# Patient Record
Sex: Female | Born: 1935 | Race: White | Hispanic: No | Marital: Married
Health system: Southern US, Community
[De-identification: ages and names within clinical notes are randomized; demographics above are authoritative.]

---

## 2007-03-16 ENCOUNTER — Ambulatory Visit: Payer: Self-pay | Admitting: Family Medicine

## 2008-03-31 ENCOUNTER — Other Ambulatory Visit: Payer: Self-pay

## 2008-03-31 ENCOUNTER — Inpatient Hospital Stay: Payer: Self-pay | Admitting: Internal Medicine

## 2008-04-11 ENCOUNTER — Ambulatory Visit: Payer: Self-pay | Admitting: Family Medicine

## 2008-05-05 ENCOUNTER — Ambulatory Visit: Payer: Self-pay | Admitting: Family Medicine

## 2008-10-15 ENCOUNTER — Ambulatory Visit: Payer: Self-pay | Admitting: Family Medicine

## 2009-08-28 ENCOUNTER — Ambulatory Visit: Payer: Self-pay | Admitting: Family Medicine

## 2010-12-28 ENCOUNTER — Ambulatory Visit: Payer: Self-pay | Admitting: Family Medicine

## 2011-06-01 ENCOUNTER — Ambulatory Visit: Payer: Self-pay | Admitting: Neurology

## 2011-08-14 ENCOUNTER — Inpatient Hospital Stay: Payer: Self-pay | Admitting: Internal Medicine

## 2012-08-27 ENCOUNTER — Inpatient Hospital Stay: Payer: Self-pay | Admitting: Student

## 2012-08-27 LAB — URINALYSIS, COMPLETE
Bilirubin,UR: NEGATIVE
Hyaline Cast: 5
Ketone: NEGATIVE
Ph: 5 (ref 4.5–8.0)
RBC,UR: 15 /HPF (ref 0–5)
Specific Gravity: 1.022 (ref 1.003–1.030)
Squamous Epithelial: 1

## 2012-08-27 LAB — CBC WITH DIFFERENTIAL/PLATELET
Basophil %: 0.5 %
Eosinophil %: 3.1 %
HGB: 12.7 g/dL (ref 12.0–16.0)
Lymphocyte #: 1.1 10*3/uL (ref 1.0–3.6)
Lymphocyte %: 15.7 %
MCH: 31.8 pg (ref 26.0–34.0)
Monocyte #: 0.6 x10 3/mm (ref 0.2–0.9)
Monocyte %: 8.8 %
Neutrophil %: 71.9 %
Platelet: 233 10*3/uL (ref 150–440)
RBC: 3.98 10*6/uL (ref 3.80–5.20)
WBC: 7.2 10*3/uL (ref 3.6–11.0)

## 2012-08-27 LAB — BASIC METABOLIC PANEL
Anion Gap: 8 (ref 7–16)
Chloride: 108 mmol/L — ABNORMAL HIGH (ref 98–107)
EGFR (African American): 40 — ABNORMAL LOW
EGFR (Non-African Amer.): 35 — ABNORMAL LOW
Glucose: 100 mg/dL — ABNORMAL HIGH (ref 65–99)
Potassium: 4.2 mmol/L (ref 3.5–5.1)
Sodium: 141 mmol/L (ref 136–145)

## 2012-08-27 LAB — CK TOTAL AND CKMB (NOT AT ARMC)
CK, Total: 208 U/L (ref 21–215)
CK-MB: 13.2 ng/mL — ABNORMAL HIGH (ref 0.5–3.6)

## 2012-08-28 LAB — BASIC METABOLIC PANEL
Anion Gap: 10 (ref 7–16)
Calcium, Total: 8.4 mg/dL — ABNORMAL LOW (ref 8.5–10.1)
Co2: 22 mmol/L (ref 21–32)
EGFR (African American): 36 — ABNORMAL LOW
Potassium: 4 mmol/L (ref 3.5–5.1)
Sodium: 139 mmol/L (ref 136–145)

## 2012-08-29 LAB — BASIC METABOLIC PANEL
Anion Gap: 10 (ref 7–16)
Calcium, Total: 8.6 mg/dL (ref 8.5–10.1)
Chloride: 110 mmol/L — ABNORMAL HIGH (ref 98–107)
Co2: 21 mmol/L (ref 21–32)
Glucose: 163 mg/dL — ABNORMAL HIGH (ref 65–99)
Osmolality: 289 (ref 275–301)
Potassium: 3.8 mmol/L (ref 3.5–5.1)
Sodium: 141 mmol/L (ref 136–145)

## 2012-08-31 LAB — PLATELET COUNT: Platelet: 205 10*3/uL (ref 150–440)

## 2013-01-17 ENCOUNTER — Emergency Department: Payer: Self-pay | Admitting: Emergency Medicine

## 2013-01-24 ENCOUNTER — Inpatient Hospital Stay: Payer: Self-pay | Admitting: Internal Medicine

## 2013-01-24 LAB — CBC
MCHC: 34.4 g/dL (ref 32.0–36.0)
MCV: 93 fL (ref 80–100)
Platelet: 278 10*3/uL (ref 150–440)
RDW: 12.1 % (ref 11.5–14.5)
WBC: 11.1 10*3/uL — ABNORMAL HIGH (ref 3.6–11.0)

## 2013-01-24 LAB — BASIC METABOLIC PANEL
Calcium, Total: 9.1 mg/dL (ref 8.5–10.1)
Chloride: 103 mmol/L (ref 98–107)
Co2: 26 mmol/L (ref 21–32)
Creatinine: 1.72 mg/dL — ABNORMAL HIGH (ref 0.60–1.30)
EGFR (African American): 33 — ABNORMAL LOW
EGFR (Non-African Amer.): 28 — ABNORMAL LOW
Glucose: 118 mg/dL — ABNORMAL HIGH (ref 65–99)

## 2013-01-24 LAB — CK: CK, Total: 112 U/L (ref 21–215)

## 2013-01-24 LAB — PRO B NATRIURETIC PEPTIDE: B-Type Natriuretic Peptide: 827 pg/mL — ABNORMAL HIGH (ref 0–450)

## 2013-01-24 LAB — TROPONIN I: Troponin-I: <0.02 ng/mL

## 2013-01-25 LAB — CBC WITH DIFFERENTIAL/PLATELET
Basophil #: 0.1 10*3/uL (ref 0.0–0.1)
Basophil %: 1.1 %
Eosinophil %: 2.1 %
HCT: 33.5 % — ABNORMAL LOW (ref 35.0–47.0)
HGB: 11.6 g/dL — ABNORMAL LOW (ref 12.0–16.0)
Lymphocyte #: 1.1 10*3/uL (ref 1.0–3.6)
MCH: 32.2 pg (ref 26.0–34.0)
MCHC: 34.5 g/dL (ref 32.0–36.0)
Monocyte %: 11.1 %
Neutrophil #: 7.9 10*3/uL — ABNORMAL HIGH (ref 1.4–6.5)
Neutrophil %: 75 %
WBC: 10.6 10*3/uL (ref 3.6–11.0)

## 2013-01-25 LAB — BASIC METABOLIC PANEL
BUN: 18 mg/dL (ref 7–18)
Calcium, Total: 8.4 mg/dL — ABNORMAL LOW (ref 8.5–10.1)
Co2: 25 mmol/L (ref 21–32)
EGFR (African American): 38 — ABNORMAL LOW
EGFR (Non-African Amer.): 33 — ABNORMAL LOW
Glucose: 92 mg/dL (ref 65–99)
Potassium: 3.6 mmol/L (ref 3.5–5.1)

## 2013-01-27 LAB — CBC WITH DIFFERENTIAL/PLATELET
Eosinophil %: 0 %
HGB: 12 g/dL (ref 12.0–16.0)
Lymphocyte #: 0.7 10*3/uL — ABNORMAL LOW (ref 1.0–3.6)
MCV: 93 fL (ref 80–100)
Monocyte %: 2.2 %
Neutrophil %: 93.8 %
Platelet: 329 10*3/uL (ref 150–440)
RBC: 3.72 10*6/uL — ABNORMAL LOW (ref 3.80–5.20)
WBC: 18.4 10*3/uL — ABNORMAL HIGH (ref 3.6–11.0)

## 2013-01-27 LAB — BASIC METABOLIC PANEL
Calcium, Total: 9.3 mg/dL (ref 8.5–10.1)
Co2: 26 mmol/L (ref 21–32)
Creatinine: 1.51 mg/dL — ABNORMAL HIGH (ref 0.60–1.30)
EGFR (African American): 38 — ABNORMAL LOW
Glucose: 149 mg/dL — ABNORMAL HIGH (ref 65–99)
Potassium: 4.1 mmol/L (ref 3.5–5.1)
Sodium: 142 mmol/L (ref 136–145)

## 2013-01-28 LAB — CBC WITH DIFFERENTIAL/PLATELET
Basophil #: 0 10*3/uL (ref 0.0–0.1)
Basophil %: 0.1 %
Eosinophil #: 0 10*3/uL (ref 0.0–0.7)
Eosinophil %: 0 %
HCT: 32.7 % — ABNORMAL LOW (ref 35.0–47.0)
Lymphocyte #: 0.6 10*3/uL — ABNORMAL LOW (ref 1.0–3.6)
Lymphocyte %: 3.1 %
MCH: 31.7 pg (ref 26.0–34.0)
MCHC: 34.1 g/dL (ref 32.0–36.0)
MCV: 93 fL (ref 80–100)
Monocyte %: 2 %
Neutrophil %: 94.8 %
Platelet: 318 10*3/uL (ref 150–440)
RBC: 3.53 10*6/uL — ABNORMAL LOW (ref 3.80–5.20)
RDW: 12.6 % (ref 11.5–14.5)

## 2013-01-28 LAB — BASIC METABOLIC PANEL
Anion Gap: 9 (ref 7–16)
BUN: 49 mg/dL — ABNORMAL HIGH (ref 7–18)
Calcium, Total: 9.1 mg/dL (ref 8.5–10.1)
Co2: 24 mmol/L (ref 21–32)
Creatinine: 1.57 mg/dL — ABNORMAL HIGH (ref 0.60–1.30)
EGFR (African American): 36 — ABNORMAL LOW
Glucose: 147 mg/dL — ABNORMAL HIGH (ref 65–99)
Sodium: 140 mmol/L (ref 136–145)

## 2013-01-30 LAB — CBC WITH DIFFERENTIAL/PLATELET
Basophil %: 0.5 %
Eosinophil %: 0.1 %
HGB: 13.7 g/dL (ref 12.0–16.0)
Lymphocyte #: 0.8 10*3/uL — ABNORMAL LOW (ref 1.0–3.6)
Lymphocyte %: 4.9 %
MCH: 31.5 pg (ref 26.0–34.0)
MCHC: 33.4 g/dL (ref 32.0–36.0)
MCV: 94 fL (ref 80–100)
Monocyte #: 0.9 x10 3/mm (ref 0.2–0.9)
Neutrophil %: 89 %
RBC: 4.34 10*6/uL (ref 3.80–5.20)
RDW: 12.4 % (ref 11.5–14.5)

## 2013-02-01 LAB — BRONCHIAL WASH CULTURE

## 2013-02-19 ENCOUNTER — Institutional Professional Consult (permissible substitution): Payer: Self-pay | Admitting: Pulmonary Disease

## 2013-04-10 ENCOUNTER — Emergency Department: Payer: Self-pay | Admitting: Emergency Medicine

## 2013-04-10 LAB — COMPREHENSIVE METABOLIC PANEL
BUN: 13 mg/dL (ref 7–18)
Bilirubin,Total: 0.7 mg/dL (ref 0.2–1.0)
Chloride: 102 mmol/L (ref 98–107)
Co2: 32 mmol/L (ref 21–32)
Creatinine: 1.72 mg/dL — ABNORMAL HIGH (ref 0.60–1.30)
EGFR (African American): 32 — ABNORMAL LOW
Glucose: 88 mg/dL (ref 65–99)
Osmolality: 273 (ref 275–301)
SGPT (ALT): 13 U/L (ref 12–78)

## 2013-04-10 LAB — CBC
MCH: 29.9 pg (ref 26.0–34.0)
MCHC: 33.6 g/dL (ref 32.0–36.0)
MCV: 89 fL (ref 80–100)
RBC: 3.83 10*6/uL (ref 3.80–5.20)
RDW: 14.1 % (ref 11.5–14.5)

## 2014-01-14 ENCOUNTER — Inpatient Hospital Stay: Payer: Self-pay | Admitting: Internal Medicine

## 2014-01-14 LAB — CBC WITH DIFFERENTIAL/PLATELET
BASOS PCT: 1.3 %
Basophil #: 0.1 10*3/uL (ref 0.0–0.1)
Eosinophil #: 0.1 10*3/uL (ref 0.0–0.7)
Eosinophil %: 0.9 %
HCT: 29.9 % — ABNORMAL LOW (ref 35.0–47.0)
HGB: 10 g/dL — ABNORMAL LOW (ref 12.0–16.0)
LYMPHS PCT: 10.1 %
Lymphocyte #: 0.8 10*3/uL — ABNORMAL LOW (ref 1.0–3.6)
MCH: 29.6 pg (ref 26.0–34.0)
MCHC: 33.5 g/dL (ref 32.0–36.0)
MCV: 88 fL (ref 80–100)
MONOS PCT: 13.3 %
Monocyte #: 1.1 x10 3/mm — ABNORMAL HIGH (ref 0.2–0.9)
Neutrophil #: 6.2 10*3/uL (ref 1.4–6.5)
Neutrophil %: 74.4 %
Platelet: 267 10*3/uL (ref 150–440)
RBC: 3.38 10*6/uL — ABNORMAL LOW (ref 3.80–5.20)
RDW: 15.1 % — ABNORMAL HIGH (ref 11.5–14.5)
WBC: 8.3 10*3/uL (ref 3.6–11.0)

## 2014-01-14 LAB — COMPREHENSIVE METABOLIC PANEL
ALBUMIN: 3.1 g/dL — AB (ref 3.4–5.0)
ANION GAP: 7 (ref 7–16)
Alkaline Phosphatase: 70 U/L
BUN: 20 mg/dL — ABNORMAL HIGH (ref 7–18)
Bilirubin,Total: 0.4 mg/dL (ref 0.2–1.0)
CREATININE: 2.08 mg/dL — AB (ref 0.60–1.30)
Calcium, Total: 9.1 mg/dL (ref 8.5–10.1)
Chloride: 105 mmol/L (ref 98–107)
Co2: 27 mmol/L (ref 21–32)
EGFR (African American): 26 — ABNORMAL LOW
EGFR (Non-African Amer.): 22 — ABNORMAL LOW
Glucose: 112 mg/dL — ABNORMAL HIGH (ref 65–99)
Osmolality: 281 (ref 275–301)
POTASSIUM: 3.7 mmol/L (ref 3.5–5.1)
SGOT(AST): 20 U/L (ref 15–37)
SGPT (ALT): 14 U/L (ref 12–78)
Sodium: 139 mmol/L (ref 136–145)
Total Protein: 6.9 g/dL (ref 6.4–8.2)

## 2014-01-14 LAB — HEMOGLOBIN: HGB: 11.3 g/dL — ABNORMAL LOW (ref 12.0–16.0)

## 2014-01-14 LAB — CLOSTRIDIUM DIFFICILE(ARMC)

## 2014-01-15 LAB — BASIC METABOLIC PANEL
Anion Gap: 7 (ref 7–16)
BUN: 13 mg/dL (ref 7–18)
CO2: 23 mmol/L (ref 21–32)
Calcium, Total: 7.7 mg/dL — ABNORMAL LOW (ref 8.5–10.1)
Chloride: 111 mmol/L — ABNORMAL HIGH (ref 98–107)
Creatinine: 1.35 mg/dL — ABNORMAL HIGH (ref 0.60–1.30)
EGFR (African American): 43 — ABNORMAL LOW
GFR CALC NON AF AMER: 38 — AB
Glucose: 92 mg/dL (ref 65–99)
OSMOLALITY: 281 (ref 275–301)
Potassium: 3.5 mmol/L (ref 3.5–5.1)
Sodium: 141 mmol/L (ref 136–145)

## 2014-01-15 LAB — CBC WITH DIFFERENTIAL/PLATELET
BASOS PCT: 1.2 %
Basophil #: 0.1 10*3/uL (ref 0.0–0.1)
EOS ABS: 0.2 10*3/uL (ref 0.0–0.7)
EOS PCT: 2.3 %
HCT: 29.7 % — AB (ref 35.0–47.0)
HGB: 9.8 g/dL — ABNORMAL LOW (ref 12.0–16.0)
LYMPHS ABS: 0.7 10*3/uL — AB (ref 1.0–3.6)
Lymphocyte %: 9.8 %
MCH: 29.1 pg (ref 26.0–34.0)
MCHC: 33.1 g/dL (ref 32.0–36.0)
MCV: 88 fL (ref 80–100)
Monocyte #: 0.8 x10 3/mm (ref 0.2–0.9)
Monocyte %: 11.2 %
Neutrophil #: 5.7 10*3/uL (ref 1.4–6.5)
Neutrophil %: 75.5 %
Platelet: 257 10*3/uL (ref 150–440)
RBC: 3.38 10*6/uL — AB (ref 3.80–5.20)
RDW: 14.9 % — ABNORMAL HIGH (ref 11.5–14.5)
WBC: 7.5 10*3/uL (ref 3.6–11.0)

## 2014-01-16 LAB — WBCS, STOOL

## 2014-03-02 ENCOUNTER — Inpatient Hospital Stay: Payer: Self-pay | Admitting: Internal Medicine

## 2014-03-02 LAB — PROTIME-INR
INR: 1
Prothrombin Time: 13.2 secs (ref 11.5–14.7)

## 2014-03-02 LAB — BASIC METABOLIC PANEL
Anion Gap: 6 — ABNORMAL LOW (ref 7–16)
BUN: 16 mg/dL (ref 7–18)
Calcium, Total: 9 mg/dL (ref 8.5–10.1)
Chloride: 104 mmol/L (ref 98–107)
Co2: 29 mmol/L (ref 21–32)
Creatinine: 1.73 mg/dL — ABNORMAL HIGH (ref 0.60–1.30)
EGFR (African American): 32 — ABNORMAL LOW
EGFR (Non-African Amer.): 28 — ABNORMAL LOW
GLUCOSE: 110 mg/dL — AB (ref 65–99)
OSMOLALITY: 279 (ref 275–301)
Potassium: 3.9 mmol/L (ref 3.5–5.1)
Sodium: 139 mmol/L (ref 136–145)

## 2014-03-02 LAB — CBC
HCT: 33.7 % — ABNORMAL LOW (ref 35.0–47.0)
HGB: 11.2 g/dL — ABNORMAL LOW (ref 12.0–16.0)
MCH: 29.5 pg (ref 26.0–34.0)
MCHC: 33.1 g/dL (ref 32.0–36.0)
MCV: 89 fL (ref 80–100)
PLATELETS: 299 10*3/uL (ref 150–440)
RBC: 3.79 10*6/uL — AB (ref 3.80–5.20)
RDW: 14.9 % — ABNORMAL HIGH (ref 11.5–14.5)
WBC: 13.9 10*3/uL — ABNORMAL HIGH (ref 3.6–11.0)

## 2014-03-02 LAB — URINALYSIS, COMPLETE
BACTERIA: NONE SEEN
Bilirubin,UR: NEGATIVE
Glucose,UR: NEGATIVE mg/dL (ref 0–75)
KETONE: NEGATIVE
Leukocyte Esterase: NEGATIVE
Nitrite: NEGATIVE
Ph: 5 (ref 4.5–8.0)
Protein: NEGATIVE
RBC,UR: 2 /HPF (ref 0–5)
Specific Gravity: 1.011 (ref 1.003–1.030)
Squamous Epithelial: NONE SEEN
WBC UR: 1 /HPF (ref 0–5)

## 2014-03-02 LAB — APTT: Activated PTT: 29.6 secs (ref 23.6–35.9)

## 2014-03-03 LAB — CBC WITH DIFFERENTIAL/PLATELET
BASOS ABS: 0.1 10*3/uL (ref 0.0–0.1)
Basophil %: 1.4 %
Eosinophil #: 0.2 10*3/uL (ref 0.0–0.7)
Eosinophil %: 1.7 %
HCT: 33.1 % — ABNORMAL LOW (ref 35.0–47.0)
HGB: 10.9 g/dL — ABNORMAL LOW (ref 12.0–16.0)
LYMPHS PCT: 14.1 %
Lymphocyte #: 1.4 10*3/uL (ref 1.0–3.6)
MCH: 29.7 pg (ref 26.0–34.0)
MCHC: 33.1 g/dL (ref 32.0–36.0)
MCV: 90 fL (ref 80–100)
Monocyte #: 0.9 x10 3/mm (ref 0.2–0.9)
Monocyte %: 9.3 %
Neutrophil #: 7.1 10*3/uL — ABNORMAL HIGH (ref 1.4–6.5)
Neutrophil %: 73.5 %
PLATELETS: 262 10*3/uL (ref 150–440)
RBC: 3.68 10*6/uL — AB (ref 3.80–5.20)
RDW: 14.8 % — AB (ref 11.5–14.5)
WBC: 9.6 10*3/uL (ref 3.6–11.0)

## 2014-03-03 LAB — BASIC METABOLIC PANEL
ANION GAP: 5 — AB (ref 7–16)
BUN: 13 mg/dL (ref 7–18)
CO2: 26 mmol/L (ref 21–32)
Calcium, Total: 8.5 mg/dL (ref 8.5–10.1)
Chloride: 111 mmol/L — ABNORMAL HIGH (ref 98–107)
Creatinine: 1.4 mg/dL — ABNORMAL HIGH (ref 0.60–1.30)
EGFR (African American): 41 — ABNORMAL LOW
GFR CALC NON AF AMER: 36 — AB
Glucose: 111 mg/dL — ABNORMAL HIGH (ref 65–99)
Osmolality: 284 (ref 275–301)
Potassium: 4.6 mmol/L (ref 3.5–5.1)
Sodium: 142 mmol/L (ref 136–145)

## 2014-03-04 LAB — CBC WITH DIFFERENTIAL/PLATELET
Basophil #: 0.1 x10 3/mm 3
Basophil %: 1.2 %
Eosinophil #: 0.3 x10 3/mm 3
Eosinophil %: 2.6 %
HCT: 34.3 % — ABNORMAL LOW
HGB: 11.2 g/dL — ABNORMAL LOW
Lymphocyte %: 11.8 %
Lymphs Abs: 1.3 x10 3/mm 3
MCH: 29.5 pg
MCHC: 32.5 g/dL
MCV: 91 fL
Monocyte #: 1.1 "x10 3/mm " — ABNORMAL HIGH
Monocyte %: 9.7 %
Neutrophil #: 8.3 x10 3/mm 3 — ABNORMAL HIGH
Neutrophil %: 74.7 %
Platelet: 241 x10 3/mm 3
RBC: 3.79 X10 6/mm 3 — ABNORMAL LOW
RDW: 15.1 % — ABNORMAL HIGH
WBC: 11.2 x10 3/mm 3 — ABNORMAL HIGH

## 2014-03-04 LAB — BASIC METABOLIC PANEL
Anion Gap: 6 — ABNORMAL LOW (ref 7–16)
BUN: 13 mg/dL (ref 7–18)
CALCIUM: 8.4 mg/dL — AB (ref 8.5–10.1)
CHLORIDE: 108 mmol/L — AB (ref 98–107)
Co2: 25 mmol/L (ref 21–32)
Creatinine: 1.16 mg/dL (ref 0.60–1.30)
EGFR (African American): 52 — ABNORMAL LOW
GFR CALC NON AF AMER: 45 — AB
Glucose: 83 mg/dL (ref 65–99)
Osmolality: 277 (ref 275–301)
POTASSIUM: 4.9 mmol/L (ref 3.5–5.1)
SODIUM: 139 mmol/L (ref 136–145)

## 2014-03-05 LAB — CBC WITH DIFFERENTIAL/PLATELET
BASOS ABS: 0.1 10*3/uL (ref 0.0–0.1)
BASOS PCT: 1.1 %
EOS PCT: 2.4 %
Eosinophil #: 0.3 10*3/uL (ref 0.0–0.7)
HCT: 31.4 % — AB (ref 35.0–47.0)
HGB: 10.6 g/dL — ABNORMAL LOW (ref 12.0–16.0)
Lymphocyte #: 1 10*3/uL (ref 1.0–3.6)
Lymphocyte %: 9.2 %
MCH: 30 pg (ref 26.0–34.0)
MCHC: 33.7 g/dL (ref 32.0–36.0)
MCV: 89 fL (ref 80–100)
Monocyte #: 0.9 x10 3/mm (ref 0.2–0.9)
Monocyte %: 8.4 %
NEUTROS ABS: 8.5 10*3/uL — AB (ref 1.4–6.5)
Neutrophil %: 78.9 %
Platelet: 227 10*3/uL (ref 150–440)
RBC: 3.53 10*6/uL — ABNORMAL LOW (ref 3.80–5.20)
RDW: 14.4 % (ref 11.5–14.5)
WBC: 10.8 10*3/uL (ref 3.6–11.0)

## 2014-03-06 LAB — CBC WITH DIFFERENTIAL/PLATELET
Basophil #: 0.1 10*3/uL (ref 0.0–0.1)
Basophil %: 1.4 %
Eosinophil #: 0.4 10*3/uL (ref 0.0–0.7)
Eosinophil %: 3.6 %
HCT: 30.2 % — ABNORMAL LOW (ref 35.0–47.0)
HGB: 10.4 g/dL — ABNORMAL LOW (ref 12.0–16.0)
Lymphocyte #: 1 10*3/uL (ref 1.0–3.6)
Lymphocyte %: 9.8 %
MCH: 30.7 pg (ref 26.0–34.0)
MCHC: 34.4 g/dL (ref 32.0–36.0)
MCV: 89 fL (ref 80–100)
MONO ABS: 1 x10 3/mm — AB (ref 0.2–0.9)
Monocyte %: 9.6 %
NEUTROS PCT: 75.6 %
Neutrophil #: 7.6 10*3/uL — ABNORMAL HIGH (ref 1.4–6.5)
PLATELETS: 250 10*3/uL (ref 150–440)
RBC: 3.38 10*6/uL — AB (ref 3.80–5.20)
RDW: 14.3 % (ref 11.5–14.5)
WBC: 10.1 10*3/uL (ref 3.6–11.0)

## 2014-05-20 DEATH — deceased

## 2015-01-06 NOTE — H&P (Signed)
PATIENT NAME:  ZEA, KOSTKA MR#:  [id number] DATE OF BIRTH:  Mar 17, 1933  DATE OF ADMISSION:  08/27/2012  PRIMARY CARE PHYSICIAN: Leo Grosser, MD   REFERRING PHYSICIAN:   Rockne Menghini, MD    CHIEF COMPLAINT: Cough, sputum, shortness of breath for three days.   HISTORY OF PRESENT ILLNESS: The patient is a 81 year old Caucasian female with a history of chronic obstructive pulmonary disease, hypertension, dementia, presented to the ED with cough, sputum, shortness of breath for three days. The patient denies any fever or chills. No headache or dizziness. No chest pain, palpitation, orthopnea, or nocturnal dyspnea. The patient's oxygen saturation decreased to 80s, and ABG showed pO2 51. The patient was treated with a nebulizer and oxygen, admitted for chronic obstructive pulmonary disease exacerbation. By Dr. Sharma Covert.   PAST MEDICAL HISTORY:  1. Chronic obstructive pulmonary disease.  2. Dementia.  3. Hypertension.   PAST SURGICAL HISTORY: Ankle surgery.   FAMILY HISTORY: No coronary artery disease, hypertension or diabetes.   SOCIAL HISTORY: The patient denies any smoking or drinking or illicit drugs actively. She said she quit smoking a long time ago.   ALLERGIES: Penicillin.   HOME MEDICATIONS:  1. Zocor 10 mg p.o. at bedtime.  2. Seroquel 12.5 mg once at bedtime.  3. Lamotrigine 50 mg, two  tablets once a day at bedtime.  4. Soft ferrous sulfate 325 mg p.o. once daily.  5. Donepezil 5 mg p.o. at bedtime once.  6. Doc-Q-Lace 100 mg p.o. once a day.  7. Norvasc 10 mg p.o. once a day.  8. Advair 100 mcg/50 mcg inhalation powder one puff inhaled twice a day. Marland Kitchen   REVIEW OF SYSTEMS: CONSTITUTIONAL: The patient denies any fever or chills. No headache or dizziness. No weakness. EYES: No double vision, blurred vision. ENT: No epistaxis. No slurred speech or dysphagia. No postnasal drip. CARDIOVASCULAR: No chest pain, palpitation, orthopnea, or nocturnal dyspnea. No leg edema.  PULMONARY: Positive for cough, sputum, shortness of breath, but no hemoptysis, no wheezing. GASTROINTESTINAL:  No abdominal pain, nausea, vomiting, or diarrhea. No melena or bloody stool. GENITOURINARY: No dysuria, hematuria, or incontinence. SKIN: No rash or jaundice. NEUROLOGY: No syncope, loss of consciousness or seizure. HEMATOLOGY: No easy bruising or bleeding. ENDOCRINE: No heat or cold intolerance. No polyuria, polydipsia.   PHYSICAL EXAMINATION:  VITAL SIGNS: Temperature 96.6, blood pressure 145/70, pulse 79, respirations 18, oxygen saturation 96%.   GENERAL: The patient is alert, awake, oriented, in no acute distress.   HEENT: Pupils are round, equal, and reactive to light, accommodation. Moist oral mucosa. Clear oropharynx.   NECK: Supple. No JVD or carotid bruit. No lymphadenopathy. No thyromegaly.   CARDIOVASCULAR: S1, S2, regular rate and rhythm. No murmurs or gallops.   PULMONARY: Bilateral air entry, very weak breath sounds.  No wheezing, crackles or rales. No use of accessory muscles to breathe.   ABDOMEN: Soft. No distention. No tenderness. No organomegaly. Bowel sounds are present.   EXTREMITIES: No edema, clubbing, or cyanosis. No calf tenderness. Strong pedal pulses.    SKIN: No rash or jaundice.   NEUROLOGIC: Alert and oriented x3. No focal deficit. Power five out of five. Sensation intact.   LABORATORY, DIAGNOSTIC AND RADIOLOGICAL DATA: Glucose 100, BUN 14, creatinine 1.44, sodium 144, potassium 4.2, chloride 108.  CK-MB 13.2, CK 208. Troponin less than 0.02. CBC normal. Urinalysis showed RBC 15, WBC 61, and nitrite negative. ABG showed pH 7.40, pCO2 39, CO2 51. EKG showed normal sinus rhythm at 72 beats  per minute.   IMPRESSION:  1. Chronic obstructive pulmonary disease exacerbation.  2. Acute respiratory failure with hypoxia.  3. Urinary tract infection.  4. Acute renal failure.  5. Hypertension.  6. Dementia.   PLAN OF TREATMENT:  1. The patient will be  admitted to the Medical floor. We will start Solu-Medrol. We will give nebulizer p.r.n., continue Advair.   2. We will start Zithromax and Rocephin for chronic obstructive pulmonary disease exacerbation and urinary tract infection, and follow-up urine culture.  3. For mild acute renal failure, we will give IV fluid support and a follow-up BMP.  4. Continue hypertension medication and dementia medication.  5. GI and deep vein thrombosis prophylaxis.   I discussed the patient's situation and plan of treatment with the patient.   TIME SPENT: About 62 minutes.   ____________________________ Shaune PollackQing Katilynn Sinkler, MD qc:cbb D: 08/27/2012 20:13:10 ET T: 08/28/2012 06:44:52 ET JOB#: [id number]  cc: Shaune PollackQing Gwendolynn Merkey, MD, <Dictator> Leo GrosserNancy J. Maloney, MD Shaune PollackQING Vasily Fedewa MD ELECTRONICALLY SIGNED 08/28/2012 16:12

## 2015-01-06 NOTE — Discharge Summary (Signed)
PATIENT NAME:  Cathy [name], Cathy [name] MR#:  [id number] DATE OF BIRTH:  [birth year]  DATE OF ADMISSION:  08/27/2012 DATE OF DISCHARGE:  08/31/2012   CHIEF COMPLAINT: Cough, sputum and shortness of breath.   PRIMARY CARE PHYSICIAN: Dr. Elease Hashimoto.   DISCHARGE DIAGNOSES:  1. Acute respiratory failure.  2. Pneumonia.  3. Chronic obstructive pulmonary disease.  4. Dementia.  5. Hypertension.  6. Chronic kidney disease.  7. Urinary tract infection with Klebsiella pneumoniae.   DISCHARGE MEDICATIONS:  1. Levaquin 750 mg for three days.  2. Prednisone 30 mg daily for two days, then stop.  3. Donepezil 5 mg daily.  4. Lamotrigine 50 mg 2 tabs once a day at bedtime. 5. Seroquel 12.5 mg once a day at bedtime. 6. Ferrous sulfate 325 mg once daily. 7. Doc/Q/Lac 100 mg oral capsule once a day. 8. Amlodipine 10 mg 1 tab daily.  9. Simvastatin 10 mg daily.  10. Advair 100/50 micrograms 1 puff inhaled twice a day.   REFERRAL: The patient will be going home with home health PT and R.N. with 2 liters of nasal cannula continuously.   DIET: Low sodium.   ACTIVITY: As tolerated.   FOLLOWUP: Please follow with primary care physician within 1 to 2 weeks and repeat x-ray of the chest in four to six weeks.   CODE STATUS: FULL CODE.   HISTORY OF PRESENT ILLNESS: For the full details of the history and physical, please see the dictation on 08/27/2012 by Dr. Imogene Burn, but briefly, this is a pleasant 81 year old female with chronic obstructive pulmonary disease, hypertension and dementia who presented with cough, sputum and shortness of breath. The patient had hypoxia on arrival with oxygen saturations as low as 80s per history and physical with shortness of breath and, therefore, was admitted to the hospitalist service.   SIGNIFICANT LABS AND IMAGING: Initial creatinine 1.44, sodium 141, potassium 4.2. Troponin negative. Initial WBC 7.2. Hemoglobin and hematocrit and platelets within normal limits. Urine cultures  growing back Klebsiella pneumoniae sensitive to ceftriaxone and Levaquin. Initial ABG shows pH of 7.4, pCO2 39 and pO2 of 51. Initial x-ray PA and lateral on arrival showing chronic obstructive pulmonary disease, thickening of the interstitial markings, area of increased density projects in the region of the right middle lobe. Atelectasis versus infiltrate in the right middle lobe. A repeat x-ray on 12/11 showing interstitial infiltrate, edema versus non-edema.   HOSPITAL COURSE: The patient was admitted to the hospitalist service and started on ceftriaxone, azithromycin and steroids. The patient was thought to have chronic obstructive pulmonary disease as well as likely pneumonia given the initial x-ray. She had no fever and had no significant leukocytosis here. Urinalysis was also positive and, therefore, urine cultures were sent. Urine cultures came back positive for Klebsiella pneumonia. The patient has been on azithromycin and ceftriaxone. The patient has had four doses of ceftriaxone already and will be discharged with additional three days of Levaquin. This should treat the pneumonia as well as the urinary tract infection. The patient was deemed to be hypoxic requiring oxygen and will be discharged with two liters of continuous oxygen. In addition, she will be going home with prednisone. Her GFR appears to be at baseline. Blood pressure is stable and her dementia is at baseline. The patient will be going home with home health PT and RN.         TOTAL TIME SPENT: 35 minutes.   ____________________________ Krystal Eaton, MD sa:ap D: 08/31/2012 10:36:41 ET T: 08/31/2012 11:31:11 ET JOB#:  [id number]  cc: Krystal EatonShayiq Trinh Sanjose, MD, <Dictator> Leo GrosserNancy J. Maloney, MD Marcelle SmilingSHAYIQ [facility] IncHMADZIA MD ELECTRONICALLY SIGNED 09/18/2012 14:08

## 2015-01-09 NOTE — Discharge Summary (Signed)
PATIENT NAME:  Cathy Cathy [name], Cathy Cathy [name] MR#:  [id number] DATE OF BIRTH:  31-Jul-1933  DATE OF ADMISSION:  01/24/2013 DATE OF DISCHARGE:  01/30/2013  DISCHARGE DIAGNOSES: 1.  Acute on chronic respiratory failure likely due to right-sided lung collapse from mucous plugging, status post bronchoscopy and cleaned up,  not showing any mass, only had mucus,  which was removed, and also had underlying pneumonia, improving now on antibiotics on room air.  2.  Postobstructive pneumonia, improving on antibiotics.  3.  Acute delirium and agitation, likely psychosis due to ongoing acute illness, now improving.  4.  Chronic obstructive pulmonary disease, stable.  5.  Acute on chronic kidney disease stage II to III, close to baseline with baseline creatinine of 1.4, likely prerenal in nature, improving with hydration.  6. Chronic T11 vertebral compression fracture, likely from previous fall. Pain is well controlled at this time and improving with therapist.  SECONDARY DIAGNOSES: 1.  Chronic obstructive pulmonary disease.  2.  Lewy body dementia.  3.  Hypertension.   CONSULTATIONS:  1.  Physical therapy.  2.  Pulmonary, Dr. Belia HemanKasa   PROCEDURES/RADIOLOGY: Chest x-ray on 01/24/2013 showed [name] right lung volume loss. Small right pleural effusion. Right basilar atelectasis. Possible right lower lobe collapse with mediastinal shift.   Chest x-ray on 01/25/2013 showed complete opacification of the right hemithorax with rightward deviation of the mediastinum and trachea most concerning for right lung collapse.   Chest x-ray on 01/26/2013 showed mild increased aeration within the right lung apex. Otherwise diffuse consolidation present. Near complete collapse of the right lung in the absence of known iatrogenic intervention.   Chest x-ray on 01/30/2013 showed atelectasis versus infiltrate within the right lung base. Resolving pleural effusion. Pulmonary vascular congestion in the left hemithorax. Cathy [name] rib fracture with callus  formation along the posterolateral aspect of the 7th rib on the left.    CT scan of the chest without contrast on 01/24/2013 showed small right pleural effusion. Right lower lobe collapse with occlusion of right lower lobe bronchus. Underlying mass cannot be excluded. T11 vertebral body compression fracture, which is unchanged compared to 01/17/2013.   MAJOR LABORATORY PANEL: Blood cultures x2 were negative on 01/24/2013.   Bronchial washing grew moderate growth of yeast.   HISTORY AND SHORT HOSPITAL COURSE: The patient is Cathy [name] 81 year old female with the above-mentioned medical problems who was admitted for acute on chronic respiratory failure thought to be secondary to right lung collapse from mucus plugging and postobstructive  pneumonia. Please see Dr. Larose HiresVachhani's dictated history and physical for further details. The patient was started on IV antibiotics and pulmonary consultation was obtained with Dr. Belia HemanKasa, who recommended bronchoscopy, which was performed on 01/29/2013, which did not show any mass and the mucus plugging was removed. The patient was slowly improving with oxygen requirement going down. She did have some periods of agitation and psychosis while in the hospital, which was improving also as her acute illness was improved. She was working with physical therapy and was recommended skilled nursing facility, where she is being discharged in stable condition.   PERTINENT PHYSICAL EXAMINATION ON THE DATE OF DISCHARGE:  VITAL SIGNS: On the date of discharge her vital signs were as follows: Temperature 99, heart rate 85 per minute, respirations 18 per minute, blood pressure 134/69, and she was saturating 97% on 2 liters oxygen via nasal cannula.  CARDIOVASCULAR: S1, S2 normal. No murmurs, rubs or gallop.  LUNGS: Clear to auscultation bilaterally. No wheezing, rales, rhonchi or crepitation.  ABDOMEN: Soft,  benign.  NEUROLOGIC: Nonfocal examination.   All other physical examination remained at  baseline.   DISCHARGE MEDICATIONS: 1.  Amlodipine 10 mg p.o. daily.  2.  Simvastatin 10 mg p.o. at bedtime.  3.  Tramadol 50 mg p.o. every 4 hours as needed.  4.  Donepezil 10 mg p.o. at bedtime.  5.  Quetiapine  12.5 mg p.o. 3 times [name] day.   6.  Prednisone 60 mg p.o. daily, taper 10 mg daily until finished.  7.  Spiriva once daily.  8.  Levaquin 750 mg p.o. daily for 5 more days.   DISCHARGE DIET: Low sodium, low fat, low cholesterol.   DISCHARGE ACTIVITY: As tolerated.   DISCHARGE INSTRUCTIONS AND FOLLOW-UP:  1.  The patient was instructed to follow up with her primary care physician, Dr. Lorie Phenix, in 1 to 2 weeks.  2.  She was also requested to follow up with [facility] in 2 to 4 weeks. 3.  She was requested to get 1 to 2 liters oxygen via nasal cannula continuous for now until she gets re-evaluated by [facility] or her primary care physician. She will be going [name] where she will be getting physical therapy evaluation and management.   TOTAL TIME DISCHARGING THIS PATIENT: 55 minutes.    ____________________________ Ellamae Sia. Sherryll Burger, MD vss:cc D: 01/30/2013 15:43:00 ET T: 01/30/2013 16:46:28 ET JOB#: [id number]  cc: Leo Grosser, MD Dory Larsen, MD Lupita Leash, MD Jaremy Nosal S. Sherryll Burger, MD, <Dictator>   Ellamae Sia [facility] MD ELECTRONICALLY SIGNED 02/01/2013 14:24

## 2015-01-09 NOTE — H&P (Signed)
PATIENT NAME:  Cathy [name], Cathy [name] MR#:  [id number] DATE OF BIRTH:  04/20/1933  DATE OF ADMISSION:  01/24/2013  PRIMARY CARE PHYSICIAN: Lorie Phenix, MD  ER REFERRING PHYSICIAN: Lowella Fairy, MD     CHIEF COMPLAINT: Shortness of breath.   HISTORY OF PRESENT ILLNESS: The patient is a 81 year old Caucasian female with past history of COPD, emphysema, Lewy body dementia, hypertension who had a fall last week and came to the Emergency Room; and x-rays were done which did not show any fracture of her ribs or her hand, and so she was discharged home with pain medication. History is obtained from her husband  as the patient is severely short of breath and on BiPAP and has some hearing problem.  As per husband, since she went home last week, due to pain she remained most of the time in the bed and not eating well or drinking well, and so she was gradually worsening, so finally the husband decided to bring her to the Emergency Room today again. On arrival, she was hypoxic on room air up to 75 to 78%. She was given a nonrebreather mask, and ABG was done which showed severe hypoxia, so chest x-ray and CT of the chest were done to rule out any pulmonary embolism which showed right lower lobe atelectasis versus volume loss due to mucus plug.  She is being admitted for further management for hospitalist care. On further questioning, husband denies any complaint of cough or fever.   REVIEW OF SYSTEMS:  CONSTITUTIONAL: Negative for fever, fatigue. Positive for generalized weakness.  EYES: No blurring, double vision, pain or redness.  EARS, NOSE, THROAT: No tinnitus, ear pain or hearing loss.  RESPIRATORY: No cough, wheezing, hemoptysis but has chest pain and shortness of breath.  CARDIOVASCULAR: No orthopnea, edema or arrhythmia.  GASTROINTESTINAL: No nausea, vomiting, diarrhea or abdominal pain.  GENITOURINARY: No dysuria, hematuria or increased frequency of the urination.  SKIN: No acne or rashes on the skin.   MUSCULOSKELETAL: Has some pain NEUROLOGICAL: Denies any numbness, weakness or tremors.   PAST MEDICAL HISTORY: COPD, Lewy body dementia, hypertension.   PAST SURGICAL HISTORY:  Ankle surgery.   FAMILY HISTORY: Cancer in family, but all of them died at old age.   SOCIAL HISTORY: The patient is a smoker, smokes 3 to 4 cigarettes a day. Denies any illicit drug use or alcohol use.   HOME MEDICATIONS: Tramadol 50 mg oral tablet every 4 hours as needed for pain, Simvastatin 10 mg oral once a day, quetiapine 25 mg oral tablet take 0.5 tablets 3 times a day, donepezil 10 mg oral tablet once a day, amlodipine 10 mg oral tablet once a day.   PHYSICAL EXAMINATION: VITAL SIGNS: In the ER, temperature 98, pulse rate 85, respirations 24, blood pressure 159/71 and pulse ox 84% on room air and with BiPAP 95%.  GENERAL: The patient is thin, in acute distress due to respiratory problem, on BiPAP but cooperative with physical examination. Not able to give any history due to shortness of breath and hearing problem.  HEENT: Head and neck atraumatic. Conjunctivae pink.  NECK: Supple. No JVD.  RESPIRATORY: Right lower lobe decreased air entry, otherwise no crackles or rhonchi.  CARDIOVASCULAR: S1, S2 present. Tachycardia. No murmur.  ABDOMEN: Soft, nontender. Bowel sounds present. No organomegaly.  SKIN: No rashes.  LEGS: No edema. Moves all 4 limbs.   LABORATORY AND DIAGNOSTIC DATA:  Glucose 111, BUN 21, creatinine 1.72. BNP 827. Troponin less than 0.02. WBC 11.1, hemoglobin  12.1, platelet count 278, MCV 93.  D-dimer 2.19.  ABG: pH 7.45, pCO2 38 and pO2 61. FiO2 100% on mask.   Chest x-ray and CT of the chest done showed right lower lobe decreased volume and mucous plug.   ASSESSMENT AND PLAN: 1.  Post obstruction pneumonia:  We will continue BiPAP, incentive spirometry, Levaquin to cover for any infection. Repeat chest x-ray in a.m. and pulmonary consult.  2.  Chronic obstructive pulmonary disease:  We  will continue nebulizer treatment, currently does not appear in any acute exacerbation. No wheezing.  3.  Hypertension:  Continue amlodipine.  4.  Acute on chronic renal failure:  We will give IV fluids and monitor renal function. CK level is checked which is normal.  5.  Smoker:  Smoking cessation counseling done for 5 minutes, nicotine patch applied.  6.  Lewy body dementia:  We will continue quetiapine and donepezil.  7.  Fall and pain:  May need physical therapy.   Condition is critical due to severe hypoxia and need of BiPAP with worsening renal failure.  I spoke to her husband.   TOTAL CRITICAL CARE TIME SPENT: 60 minutes.   ____________________________ Hope PigeonVaibhavkumar G. Elisabeth PigeonVachhani, MD vgv:cb D: 01/24/2013 21:00:54 ET T: 01/24/2013 21:20:02 ET JOB#: [id number]  cc: Hope PigeonVaibhavkumar G. Elisabeth PigeonVachhani, MD, <Dictator> Altamese DillingVAIBHAVKUMAR Justen Fonda MD ELECTRONICALLY SIGNED 01/31/2013 6:23

## 2015-01-10 NOTE — Discharge Summary (Signed)
PATIENT NAME:  Cathy GongBEASLEY, Kenlee A MR#:  [id number] DATE OF BIRTH:  April 30, 1933  DATE OF ADMISSION:  01/14/2014 DATE OF DISCHARGE:  01/16/2014  ADMISSION DIAGNOSIS: Colitis.   DISCHARGE DIAGNOSES:  1. Colitis infectious in etiology.  2. Bloody stools from colitis.  3. Dementia.  4. Lung nodule.  5. Acute kidney injury.   CONSULTATION:  1. GI  2. Surgery.  LABORATORY DATA: Discharge sodium 141, potassium 3.5, chloride 111, CO2 23, BUN 13, creatinine 1.35, glucose is 92. Stool cultures are showing PMNs.  White blood cells 8.3, hemoglobin 10, hematocrit 30, platelets 257.   HOSPITAL COURSE: This is 81 year old female with dementia presents from assisted living, presented with bloody stools and diarrhea. She had a CT scan on admission which was consistent with descending and sigmoid colitis. For further details, please refer to the H and P.  1. Colitis. The patient was placed on ciprofloxacin and Flagyl. GI and surgery were consulted. Her C. difficile was negative. Her stool cultures are showing some PMNs.  She has tolerated this antibiotic regimen well. Her diarrhea is much improved. She has no bloody stools.  There is no surgical indication at this time.  2. Lung nodule. The patient had a CT scan which did show a small lung nodule. She will need a repeat CT chest in three months.  3. Dementia. The patient will continue her outpatient medications.  4. Bloody stools from her colitis, which improved. Hemoglobin remained relatively stable.  5. Acute kidney injury from dehydration and diarrhea, resolved with IV fluids.   DISCHARGE MEDICATIONS: 1. Norvasc 10 mg daily.  2. Benazepril 10 mg daily.  3. Spiriva 18 mcg daily.  4. Quetiapine 25 mg half tablet b.i.d.  5. Tylenol 500 mg t.i.d. p.r.n.  6. Mupirocin topically b.i.d. p.r.n.  7. Ciprofloxacin 250 b.i.d. x 12 days.  8. Flagyl 500 mg q.8 hours x 12 days.   DISCHARGE DIET: Low sodium.   DISCHARGE ACTIVITY: As tolerated.   DISCHARGE  FOLLOW-UP: The patient will need to follow up with Lorie PhenixNancy Maloney in one week.   TIME SPENT: 35 minutes. The patient is medically stable for discharge.   ____________________________ Johnross Nabozny P. Juliene PinaMody, MD spm:sg D: 01/16/2014 09:49:48 ET T: 01/16/2014 10:28:04 ET JOB#: [id number]  cc: Rondell Pardon P. Juliene PinaMody, MD, <Dictator> Leo GrosserNancy J. Maloney, MD  Janyth ContesSITAL P Raaga Maeder MD ELECTRONICALLY SIGNED 01/16/2014 12:20

## 2015-01-10 NOTE — Discharge Summary (Signed)
PATIENT NAME:  Cathy [name], Cathy [name] MR#:  [id number] DATE OF BIRTH:  [birth year]  DATE OF ADMISSION:  03/02/2014 DATE OF DISCHARGE:  03/06/2014  ADDENDUM   The patient's discharge summary has been dictated by Dr. Juliene PinaMody on June 17. This is an addendum to it. The patient is ready for discharge today and [facility] She remained [name] full code. Followup discharge summary instructions per Dr. Juliene PinaMody.   ____________________________ Wylie HailSona [name]. Allena KatzPatel, MD sap:jcm D: 03/07/2014 14:38:25 ET T: 03/07/2014 21:43:32 ET JOB#: [id number]  cc: [name]. Allena KatzPatel, MD, <Dictator> Willow OraSONA [name] PATEL MD ELECTRONICALLY SIGNED 03/22/2014 10:22

## 2015-01-10 NOTE — Consult Note (Signed)
PATIENT NAME:  Cathy Cathy [name], Cathy Cathy [name] MR#:  [id number] DATE OF BIRTH:  [birth year]  DATE OF CONSULTATION:  03/02/2014  CONSULTING PHYSICIAN:  Kathreen DevoidKevin L. Keyri Salberg, MD  REASON FOR CONSULTATION: Right femoral neck hip fracture.  REPORT OF CONSULTATION: Ms. Cathy GarnetBeasley is Cathy [name] 81 year old female with dementia. She was seen in the Emergency Room with her husband at the bedside. The patient has dementia, and the history was obtained from her husband. The patient [name] after [facility]athy [name] fall, which the husband states he was told was when she was in the bathroom. The patient is complaining of hip pain.   PAST MEDICAL HISTORY: Includes COPD, hypertension, dementia, and previous right lower extremity surgery.   MEDICATIONS: Include Spiriva 18 mcg inhaler, quetiapine 25 mg 1/2 tablet orally 3 times Cathy [name] day, mupirocin topical 2% ointment applied to affected area b.i.d. p.r.n., Lialda 1.2 grams orally 2 tablets b.i.d. with breakfast, donepezil 5 mg p.o. daily, amlodipine 10 mg daily.   ALLERGIES: PENICILLIN.   PHYSICAL EXAMINATION: Right hip: The patient's skin is intact. There is no erythema, ecchymosis, or swelling. The thigh compartments and lower leg compartments are soft and compressible. She has palpable pedal pulses bilaterally, and can flex and extend her toes, and dorsiflex and plantar flex her ankles bilaterally. She has intact sensation to light touch in both lower extremities.   RADIOLOGY: X-ray films of the right hip reveal an impacted nondisplaced fracture of the right femoral neck. There is no significant pre-existing osteoarthritis. There is no associated pelvic fracture seen. The patient has degenerative changes of the SI joints and lumbar spine, although the view of the lumbar spine is limited.   ASSESSMENT: Nondisplaced impacted femoral neck hip fracture, right hip.   PLAN: I am recommending that [name] undergo cannulated screw fixation of the right femoral neck hip fracture. I  reviewed this procedure with the patient's husband as well as the postoperative recovery course. I reviewed the risks and benefits of surgery, which include infection, bleeding requiring blood transfusion, nerve or blood vessel injury, malunion, nonunion, persistent right hip pain, leg length discrepancy or change in lower extremity rotation along with hardware failure, avascular necrosis, or screw cutout. Medical risks include, but are not limited to, DVT and pulmonary embolism, myocardial infarction, stroke, pneumonia, respiratory failure, and death. The patient's husband understood these risks. The patient will be n.p.o. I reviewed her labs and radiographic studies in preparation for her surgery. She will be n.p.o. after midnight, except for medications. I spoke with Dr. Judithann SheenSparks regarding this patient, and she will be cleared medically for surgery tomorrow.    ____________________________ Kathreen DevoidKevin L. Estefania Kamiya, MD klk:mr D: 03/02/2014 16:09:00 ET T: 03/02/2014 18:54:28 ET JOB#: [id number]  cc: Kathreen DevoidKevin L. Axil Copeman, MD, <Dictator> Kathreen DevoidKEVIN L Diantha Paxson MD ELECTRONICALLY SIGNED 03/09/2014 12:31

## 2015-01-10 NOTE — H&P (Signed)
   Subjective/Chief Complaint Diarrhea, brbpr   History of Present Illness Patient is alone and poor historian as she appears confused, which appears to be her baseline.  Information is from ER MD. Cathy [name] is a pleasant 81 yo F who presents with 2 days of diarrhea and brbpr.  No abdominal pain.  Has never had anything like this before.  Is on ciprofloxacin currently for UTI.  CT shows sigmoid colonic thickening.   Past History Lewy body dementia COPD/emphysema H/o recurrent pneumonias H/o CKD HTN   Past Medical Health Hypertension, Smoking, COPD, Other, Lewy Body dementia   Past Med/Surgical Hx:  copd:   htn:   Leg Surgery - Right: Tibia was broken- NOT ANKLE  Ankle Surgery - Right: anlke and tibial fracture  ALLERGIES:  PCN: Unknown  Family and Social History:  Family History Cancer  Cancer but all died at old age   Social History negative tobacco, negative ETOH   Place of Living Nursing Home   Review of Systems:  Subjective/Chief Complaint Diarrhea, BRBPR   Fever/Chills No   Cough No   Sputum No   Abdominal Pain No   Diarrhea Yes   Constipation No   Nausea/Vomiting No   SOB/DOE No   Chest Pain No   Tolerating Diet Yes   ROS Pt not able to provide ROS   Physical Exam:  GEN well developed, no acute distress   HEENT pink conjunctivae, PERRL, good dentition   RESP normal resp effort  clear BS  no use of accessory muscles   CARD regular rate  no murmur  no thrills   ABD denies tenderness  denies Flank Tenderness  no hernia  soft  normal BS  no Adominal Mass   LYMPH negative neck, negative axillae   EXTR negative cyanosis/clubbing, negative edema   SKIN normal to palpation, No rashes, skin turgor good   NEURO cranial nerves intact, negative rigidity, negative tremor   PSYCH alert, poor insight    Assessment/Admission Diagnosis Cathy [name] is a pleasant 81 yo F who presents with 2 days of diarrhea and BRBPR.  Reports no abdominal pain and  nontender on exam.  AF/VSS.  WBC nl.  CT shows sigmoid thickening.   Plan Radiographic findings consistent with segmental colitis.  As on abx and acute without pain, favor infectious.  Would send stool for c diff.  Alternatively, may represent ischemic colitis but no acute surgical needs.  Recommend serial exams and Hgb to ensure no significant bleed.  Hydrate.   Electronic Signatures: Jarvis NewcomerLundquist, Karon Heckendorn A (MD)  (Signed 28-Apr-15 18:34)  Authored: CHIEF COMPLAINT and HISTORY, PAST MEDICAL/SURGIAL HISTORY, ALLERGIES, FAMILY AND SOCIAL HISTORY, REVIEW OF SYSTEMS, PHYSICAL EXAM, ASSESSMENT AND PLAN   Last Updated: 28-Apr-15 18:34 by Jarvis NewcomerLundquist, Agapita Savarino A (MD)

## 2015-01-10 NOTE — H&P (Signed)
PATIENT NAME:  Cathy [name], [name] MR#:  [id number] DATE OF BIRTH:  [birth year]  DATE OF ADMISSION:  01/14/2014  PRIMARY CARE PHYSICIAN:  Lorie PhenixNancy Maloney, MD   HISTORY OF PRESENTING ILLNESS: A 81 year old Caucasian female patient with history of COPD, emphysema, Lewy body dementia, hypertension, presents to [facility] Emergency Room [facility] after they noticed that patient complained of some abdominal pain along with diarrhea and also bloody stool. This has happened over 2 days now. Here, the patient has not had any bowel movements but CT scan has shown extensive colitis and is being admitted to the hospitalist service.   The patient has significant dementia and is unable to contribute to history. The patient smiles to most of the questions and is pleasantly confused.   The patient has been on ciprofloxacin for UTI.  PAST MEDICAL HISTORY: 1.  COPD. 2.  Lewy body dementia. 3.  Hypertension.   PAST SURGICAL HISTORY: Ankle surgery.   FAMILY HISTORY: Cancer in the family of unknown kind per old records.   SOCIAL HISTORY: The patient is a smoker and smokes 3 to 4 cigarettes per old records.   HOME MEDICATIONS: 1.  Aricept 10 mg daily. 2.  Seroquel 12.5 mg oral b.i.d.  3.  Norvasc 10 mg daily.  4.  Ciprofloxacin 250 mg oral 2 times a day.  5.  Tylenol 500 mg oral 3 times a day as needed for pain.  6.  Spiriva 18 mcg inhaled once a day.   REVIEW OF SYSTEMS:  Unobtainable due to patient's advanced dementia, but she does not have any concerns when asked.  PHYSICAL EXAMINATION: VITAL SIGNS: Shows temperature 98.2, pulse 82, blood pressure 150/65, saturating 96% on room air.  GENERAL: Elderly Caucasian female patient lying in bed, seems comfortable, conversational, is pleasantly confused and has tangential thoughts.  PSYCHIATRIC: Alert and awake but not oriented to person, place or time.  HEENT: Atraumatic, normocephalic.  Mucosa dry and pink. External ears and nose normal. Pallor  positive. No icterus. Pupils bilaterally equal and reactive to light.  NECK: Supple. No thyromegaly or palpable lymph nodes. Trachea midline. No carotid bruit, JVD.  CARDIOVASCULAR: S1, S2, without any murmurs. Peripheral pulses 2+. No edema.  RESPIRATORY: Normal work of breathing. Clear to auscultation on both sides. GASTROINTESTINAL:  Soft abdomen. Tenderness diffusely on deep palpation. No rigidity or guarding. Bowel sounds are present. No hepatosplenomegaly palpable.  SKIN: Warm and dry. No petechiae, rash, ulcers.  MUSCULOSKELETAL: No joint swelling, redness of the large joints. Normal muscle tone.  NEUROLOGICAL: Motor strength 5/5 in upper and lower extremities. Sensation  is intact all over.  LYMPHATIC: No cervical lymphadenopathy.  LABORATORY STUDIES:  Show glucose 112, BUN 20, creatinine 2.08. Sodium 139, potassium 3.7, chloride 105, GFR 22. AST, ALT, alkaline phosphatase and bilirubin normal. WBC 8.3, hemoglobin 10, platelets of 267. Blood group is B+. C. difficile is negative.   CT scan of the abdomen and pelvis without contrast shows colitis involving the distal descending and sigmoid colon. Given extent of underdistention, contiguous inflammation involving the rectum is possible, likely infectious or ischemic.   Also right lung base nodule with bronchial filling process. This is a 5 mm nodule which needs followup.   ASSESSMENT AND PLAN: 1.  Acute ascending and sigmoid colitis with gastrointestinal bleed in a patient with stable hemoglobin. We will admit the patient for IV antibiotics with ciprofloxacin and Flagyl. We will get blood cultures. Monitor hemoglobin closely, start her on IV fluids. The patient will  be on clear liquids at this time in case she needs any procedures. No heparin, Lovenox or any other blood thinners. The patient is on antibiotics and there was suspicion for Clostridium difficile, but this is negative. Will consult gastroenterology for further input with the case.   2.  Acute renal failure with chronic kidney disease. We will start her on IV fluids. This is secondary to dehydration from the diarrhea, although it could be progressive chronic kidney disease.  3.  Anemia of chronic disease, stable.  4.  A 5 mm right lower lobe lung nodule will need outpatient followup study in 3 to 6 months.  5.  Hypertension. Continue medication.  6.  Dementia. The patient has high risk for inpatient delirium. We will use medications. Can use sedative medications if needed.  7.  Deep venous thrombosis prophylaxis with sequential compression devices.   CODE STATUS: FULL CODE.   TIME SPENT TODAY ON THIS CASE:  45 minutes.   ____________________________ Molinda Bailiff Emiliya Chretien, MD srs:ce D: 01/14/2014 20:09:33 ET T: 01/14/2014 20:39:28 ET JOB#: [id number]  cc: Wardell Heath R. Elpidio Anis, MD, <Dictator> Leo Grosser, MD Orie Fisherman MD ELECTRONICALLY SIGNED 02/14/2014 14:09

## 2015-01-10 NOTE — Consult Note (Signed)
I have seen Ms Cathy [name] and agree with Cathy [name]'s a/p.  days of diarrhea in institutionalized patient.  Likely infectious.  non-con-   colitis on left.  agree with cipro and flagylnot a good candidate for colonoscopy or flex sig given poor mental status and functional statuswill cont to follow.   Electronic Signatures: Dow Adolphein, Matthew (MD)  (Signed on 29-Apr-15 17:37)  Authored  Last Updated: 29-Apr-15 17:37 by Dow Adolphein, Matthew (MD)

## 2015-01-10 NOTE — H&P (Signed)
PATIENT NAME:  Cathy [name], Cathy [name] MR#:  [id number] DATE OF BIRTH:  [birth year]  DATE OF ADMISSION:  03/02/2014  REASON FOR ADMISSION: Hip fracture.   HISTORY OF PRESENT ILLNESS: The patient is a 81 year old female who presented from [facility] assisted living facility after falling, injuring her right hip. The patient has chronic dementia with confusion. In the Emergency Room, the patient is confused and agitated and noted to have a right hip fracture. She is now admitted for further evaluation.   PAST MEDICAL HISTORY: 1.  Alzheimer's dementia.  2.  Chronic obstructive pulmonary disease.  3.  Benign hypertension.  4.  Previous right leg surgery.  5.  Previous right ankle surgery.  6.  Hyperlipidemia.  7.  Chronic kidney disease.  8.  History of colitis.  9.  Gastroesophageal reflux disease.  MEDICATIONS:   1.  Spiriva 1 capsule inhaled daily.  2.  Seroquel 12.5 mg p.o. t.i.d.  3.  Lialda 2.4 grams p.o. daily.  4.  Aricept 5 mg p.o. daily.  5.  Norvasc 10 mg p.o. daily.   ALLERGIES: PENICILLIN.   SOCIAL HISTORY: Negative for alcohol or tobacco abuse.   FAMILY HISTORY: Noncontributory.   REVIEW OF SYSTEMS: Unable to obtain.   PHYSICAL EXAMINATION: GENERAL: The patient is chronically ill-appearing, in no acute distress.  VITAL SIGNS: Remarkable for blood pressure of 178/78, heart rate 76, respiratory rate 14, temperature 98.5, sats 97% on room air.  HEENT: Normocephalic, atraumatic. Pupils equally round, reactive to light and accommodation. Extraocular movements are intact. Sclerae are anicteric. Conjunctivae are clear. Oropharynx clear. NECK: Supple without JVD. No adenopathy or thyromegaly is noted.  LUNGS: Clear to auscultation and percussion without wheezes, rales or rhonchi. No dullness. Respiratory effort is normal.  CARDIAC: Regular rate and rhythm. Normal S1, S2. No significant rubs, murmurs or gallops. PMI is nondisplaced. Chest wall is nontender.  ABDOMEN: Soft, nontender,  with normoactive bowel sounds. No organomegaly or masses were appreciated. No hernias or bruits were noted.  EXTREMITIES: Without clubbing, cyanosis or edema. Pulses were 2+ bilaterally.  SKIN: Warm and dry without rash or lesions.  NEUROLOGIC: Cranial nerves II through XII grossly intact. Deep tendon reflexes were symmetric. Motor and sensory exam is nonfocal.  PSYCHIATRIC: Revealed a patient who is alert but disoriented to person, place and time. She was uncooperative.   LABORATORY, DIAGNOSTIC AND RADIOLOGICAL DATA:  EKG revealed sinus rhythm with no acute ischemic changes. Chest x-ray was unremarkable. White count was 13.9 with a hemoglobin of 11.2. Her glucose was 110 with a BUN of 16, creatinine 1.73 with a GFR of 28 and a sodium of 137 with a potassium of 3.9.   ASSESSMENT: 1.  Acute right hip fracture.  2.  Alzheimer's dementia.  3.  Stage 3 chronic kidney disease.  4.  Anemia of chronic disease.  5.  Benign hypertension.  6.  Hyperlipidemia.   PLAN: The patient will be admitted to orthopedics with IV fluids. Will use Ativan and/or Geodon as needed for agitation. Will maximize her pulmonary regimen and continue her outpatient medications for now. Her EKG is fine. She has no cardiac history. She is medically stable and cleared for surgery. Surgery will tentatively be planned for tomorrow. We will obtain an orthopedic consult for this. Follow up routine labs in the morning. Further treatment and evaluation will depend upon the patient's progress.   TOTAL TIME SPENT ON THIS PATIENT: 50 minutes.   ____________________________ Duane Lope Judithann Sheen, MD jds:cs D: 03/02/2014 15:10:24 ET T: 03/02/2014  15:56:05 ET JOB#: [id number]  cc: Duane LopeJeffrey D. Judithann SheenSparks, MD, <Dictator> Leo GrosserNancy J. Maloney, MD JEFFREY Rodena Medin SPARKS MD ELECTRONICALLY SIGNED 03/02/2014 17:06

## 2015-01-10 NOTE — Discharge Summary (Signed)
PATIENT NAME:  Cathy [name], Cathy [name] MR#:  [id number] DATE OF BIRTH:  [birth year]  DATE OF ADMISSION:  03/02/2014 DATE OF DISCHARGE: 03/05/2014   ADMISSION DIAGNOSIS: Hip fracture.   DISCHARGE DIAGNOSES:  1.  Hip fracture with right impacted nondisplaced femoral neck.  3.  Advanced dementia.   CONSULTATIONS: Dr. Martha ClanKrasinski.   PROCEDURES: The patient underwent [name] percutaneous screw fixation for right impacted femoral neck hip fracture on March 03, 2014.   Discharge white blood cells 7.8, hemoglobin 10.6, hematocrit 32, platelet are 227. Sodium 139, potassium 4.9, chloride 108, bicarbonate 15, creatinine 1.16 and glucose is 83.   HOSPITAL COURSE: [name] 81 year old female with advanced dementia, who presented after [name] fall and found to have [name] hip fracture. For further details, please refer to H and P.  1.  Hip fracture. The patient underwent screw fixation on the 15th of June. She is postop day #2. Due to her advanced dementia, she would not benefit from skilled nursing facility, but would benefit from home health with some physical therapy. She will follow up with Dr. Martha ClanKrasinski in about 2 weeks.  2.  Acute renal failure, improved with IV fluids.  3.  Dementia. The patient will continue on donepezil and quetiapine.  4.  Hypertension. The patient was continued on her Norvasc.   DISCHARGE MEDICATIONS:  1.  Norvasc 10 mg daily.  2.  Mupirocin topical b.i.d. p.r.n.  3.  Donepezil 5 mg daily.  4.  Lialda 1.25 grams 2 tablets with breakfast.  5.  Spiriva 18 mcg daily.  6.  Quetiapine 25 mg half tablet t.i.d.  7.  Tylenol 325, 2 tablets q.4 hours p.r.n. pain or temperature.  8.  Ferrous sulfate 325 mg b.i.d.  9.  Calcium and vitamin D 1 tablet b.i.d.  10. Docusate 240 mg at bedtime.   Discharge home with home health, physical therapy and nurse for gait and assessment.   DISCHARGE DIET: Regular diet.   DISCHARGE ACTIVITY: As tolerated.  DISCHARGE FOLLOWUP: The patient to follow up with Dr. Martha ClanKrasinski  in 2 weeks and Dr. Elease HashimotoMaloney in 1 week.   TIME SPENT: 35 minutes. The patient was medically stable for discharge.  ____________________________ Sital P. Juliene PinaMody, MD spm:aw D: 03/05/2014 13:24:28 ET T: 03/05/2014 14:38:01 ET JOB#: [id number]  cc: Lorie PhenixNancy Maloney, MD  Juanell FairlyKevin Krasinski, MD Janyth ContesSITAL P MODY MD ELECTRONICALLY SIGNED 03/06/2014 15:15

## 2015-01-10 NOTE — Consult Note (Signed)
PATIENT NAME:  Cathy [name], Cathy [name] MR#:  [id number] DATE OF BIRTH:  1933-06-29  DATE OF CONSULTATION:  01/15/2014  REFERRING PHYSICIAN:  Dr. Darvin Neighbours CONSULTING PHYSICIAN:  Arther Dames, MD and Payton Emerald, NP  REASON FOR CONSULTATION: Diverticulitis.   HISTORY OF PRESENT ILLNESS: Ms. Trautmann is a 81 year old Caucasian female, who presented it appears to the Emergency Room yesterday for the concern of abdominal pain as well as some bloody stools. Her medical history is significant for COPD, emphysema. Lewy body dementia and hypertension. She is a resident at [facility] in [place]. Unable to obtain a history him the patient given her current mental status in correlation with her history of dementia. History obtained from H and P on admission.   PAST MEDICAL HISTORY:  1.  Chronic obstructive pulmonary disease.  2.  Lewy body dementia.  3.  Hypertension.   PAST SURGICAL HISTORY: Ankle surgery.   FAMILY HISTORY: Cancer in the family, but unknown specific details per old records.   SOCIAL HISTORY: Smoker, smokes 3 to 4 cigarettes a day according to, again, old records.   HOME MEDICATIONS: Aricept 10 mg a day, Seroquel 12.5 mg twice a day, Norvasc 10 mg a day, ciprofloxacin 250 mg twice a day, Tylenol 500 mg 3 times a day as needed for pain and Spiriva 18 mcg inhalation once a day.   ALLERGIES: PENICILLIN.   REVIEW OF SYSTEMS: Unable to obtain due to the patient's advanced dementia. The patient is able to state that she had been experiencing some abdominal pain.   PHYSICAL EXAMINATION:  VITAL SIGNS: Temperature is 97.6 with a pulse of 68, respirations are 17, blood pressure is 119/49 and pulse oximetry is 92% on room air.   GENERAL: Well-developed, well-nourished, 81 year old Caucasian female, very sleepy, in fact, falling off to sleep and needing to be aroused several times during the interviewing process.  HEENT: Normocephalic, atraumatic. Pupils equal and reactive to light.  NECK:  Supple. Trachea midline. No lymphadenopathy or thyromegaly.  PULMONARY: Symmetric rise and fall of chest anteriorly.  CARDIOVASCULAR: Regular rate and rhythm, S1, S2. No murmurs.  ABDOMEN: Soft, nondistended. Bowel sounds in 4 quadrants. No bruits. No masses. No tenderness.  RECTAL: Deferred.  MUSCULOSKELETAL: No contractures. No clubbing.  EXTREMITIES: No edema.  PSYCH: Unable to adequately assess. Alert and arousable to her name.  NEUROLOGICAL: Motor strength 5 over 5 upper and lower extremities. No gross neurological deficits noted.   LABORATORY/DIAGNOSTIC: Chemistry panel yesterday's date, glucose 112, BUN is 20, creatinine is 2.08. EGFR is 22, otherwise within normal limits. Hepatic panel: BUN of 3.1, otherwise within normal limits. CBC: RBC is 3.38 with hemoglobin at 10.0, hematocrit 29.9, hemoglobin is improved today to 11.3, RDW is 15.1, lymphocyte number 0.8, monocyte number 1.1. Antibody screen is negative. ABO group plus Rh type is B+. C. diff. is negative. CT scan of abdomen and pelvis without contrast, which was done yesterday's date, revealed it is a degraded examination as IV contrast was not given, colitis is noted in the distal descending and sigmoid colon. Given the extent of under distention, continuous inflammation involving the rectum is difficult to exclude. The distribution favors an infectious process. Right lung nodules in bronchial filling process.   IMPRESSION:  1.  Abnormal CT scan of abdomen and pelvis again done without intravenous contrast, findings felt to be consistent with an infectious process in correlation with colitis, which is noted.  2.  Dementia.  3.  High blood pressure. 4.  Anemia.   PLAN:  The patient's presentation will be discussed with Dr. Arther Dames. I am in agreement with patient to continue to receive antibiotic therapy of ciprofloxacin 400 mg IV every 24 hours and metronidazole 500 mg every 8 hours as ordered. We will continue to monitor the  patient during her hospitalization. Further recommendations to follow in an addendum form once I have spoken with Dr. Rayann Heman, 01/15/2014.  ____________________________ Payton Emerald, NP dsh:aw D: 01/15/2014 11:39:45 ET T: 01/15/2014 12:57:47 ET JOB#: [id number]  cc: Payton Emerald, NP, <Dictator> Payton Emerald MD ELECTRONICALLY SIGNED 01/16/2014 7:14

## 2015-01-10 NOTE — Consult Note (Signed)
Brief Consult Note: Diagnosis: Right impacted femoral neck hip fracture.   Patient was seen by consultant.   Consult note dictated.   Recommend to proceed with surgery or procedure.   Orders entered.   Discussed with Attending MD.   Comments: Discussed case with Dr. Judithann SheenSparks.  Patient will require cannulated screw fixation for this fracture tomorrow.  NPO after midnight.  Electronic Signatures: Juanell FairlyKrasinski, Seidy Labreck (MD)  (Signed 14-Jun-15 16:01)  Authored: Brief Consult Note   Last Updated: 14-Jun-15 16:01 by Juanell FairlyKrasinski, Elenore Wanninger (MD)

## 2015-01-10 NOTE — Consult Note (Signed)
Brief Consult Note: Diagnosis: Abdmoninal pain with complaint of bloody diarrhea. CT scan of abodmen and pelvis revealed evidence of colitis, felt to be infectious in nature.  Dementia.  Hypertension.   Consult note dictated.   Comments: Patient's presentation will be discussed with Dr. Dow AdolphMatthew Rein.  In agreement for patient to continue with Cipro 400 mg IV every 24 hours as ordered as well as Flagyl 500 mg IV every eight hours.  Will continue to monitor.  Once I have spoken with Mr. Shelle IronRein.  Addendum will follow.  In speaking with Mr. Shelle IronRein, will continue to monitor patient at this time.  No further recommendations other than continuation of antibiotic therapy.  Do agree possible underlying infectious process..  Electronic Signatures: Rodman KeyHarrison, Dawn S (NP)  (Signed 29-Apr-15 13:39)  Authored: Brief Consult Note   Last Updated: 29-Apr-15 13:39 by Rodman KeyHarrison, Dawn S (NP)

## 2015-01-10 NOTE — Op Note (Signed)
PATIENT NAME:  Cathy [name], Cathy [name] MR#:  [id number] DATE OF BIRTH:  11/07/1932  DATE OF PROCEDURE:  03/03/2014  PREOPERATIVE DIAGNOSES: Right impacted nondisplaced femoral neck hip fracture.   POSTOPERATIVE DIAGNOSIS: Right impacted nondisplaced femoral neck hip fracture.   PROCEDURE: Percutaneous screw fixation for right impacted femoral neck hip fracture.   SURGEON: Kathreen DevoidKevin L. Krasinski, MD   ANESTHESIA: Spinal.   ESTIMATED BLOOD LOSS: Minimal.   COMPLICATIONS: None.   IMPLANTS: The 7.3 mm cannulated screws by [employer] x[employer].   INDICATION FOR THE PROCEDURE: The patient is [name] 81 year old female with chronic dementia who is [name] nursing home patient. She sustained [name] fall at the facility, and the decision was made to proceed with surgical fixation for pain control and to allow for nursing care.   I reviewed the risks and benefits of surgery with the patient's family, who agreed with the plan to proceed with surgical fixation.   PROCEDURE NOTE: The patient was brought to the operating room. She underwent [name] spinal anesthetic by the anesthesia service. She was then prepped and draped in [name] sterile fashion. [name] timeout was performed to verify the patient's name, date of birth, medical record number, correct site of surgery and correct procedure to be performed. It was also used to verify the patient had received antibiotics and that all appropriate instruments, implants and radiographic studies were available in the room. Once all in attendance were in agreement, the case began. C-arm images were used to identify bony landmarks and allowed for planning of the surgical incision. This was drawn out with [name] surgical marker. [name] #10 blade was used to make [name] 2 to 3 cm incision in line with the femur laterally near the level of the lesser trochanter. The deep fascia was incised with [name] deep #10 blade. Four threaded K wires were then advanced through the lateral cortex of the femur, across the fracture site and into the  femoral head. The positions of these drill pins were confirmed on AP and lateral C-arm images. Once these drill pins were in adequate position, they were overdrilled and measured for length using [name] depth gauge. Four 7.3 mm cannulated screws were then advanced into position and tightened by hand. The final position of the screws was again confirmed on AP and lateral C-arm images. The drill pins were then removed. The wound was copiously irrigated. The fascia lata was then closed with interrupted 0 Vicryl suture. The subcutaneous tissue was closed with 2-0 Vicryl and the skin approximated with staples. [name] dry sterile dressing was applied. The patient was then transferred to [name] hospital bed and brought to the PACU in stable condition. I was scrubbed and present for the entire case. All sharp and instrument counts were correct at the conclusion of the case. I spoke with the patient's family postoperatively to let them know the case had gone without complication and the patient was stable in the recovery room.     ____________________________ Kathreen DevoidKevin L. Krasinski, MD klk:lb D: 03/13/2014 13:18:23 ET T: 03/13/2014 13:40:53 ET JOB#: [id number]  cc: Kathreen DevoidKevin L. Krasinski, MD, <Dictator> Kathreen DevoidKEVIN L KRASINSKI MD ELECTRONICALLY SIGNED 03/14/2014 13:47

## 2016-01-09 IMAGING — DX RIGHT HIP - 1 VIEW
1 series · 1 of 1 positions shown · non-contrast
Comparison: 03/03/2014

CLINICAL DATA: Postop

EXAM:
RIGHT HIP - 1 VIEW

[hip lat]
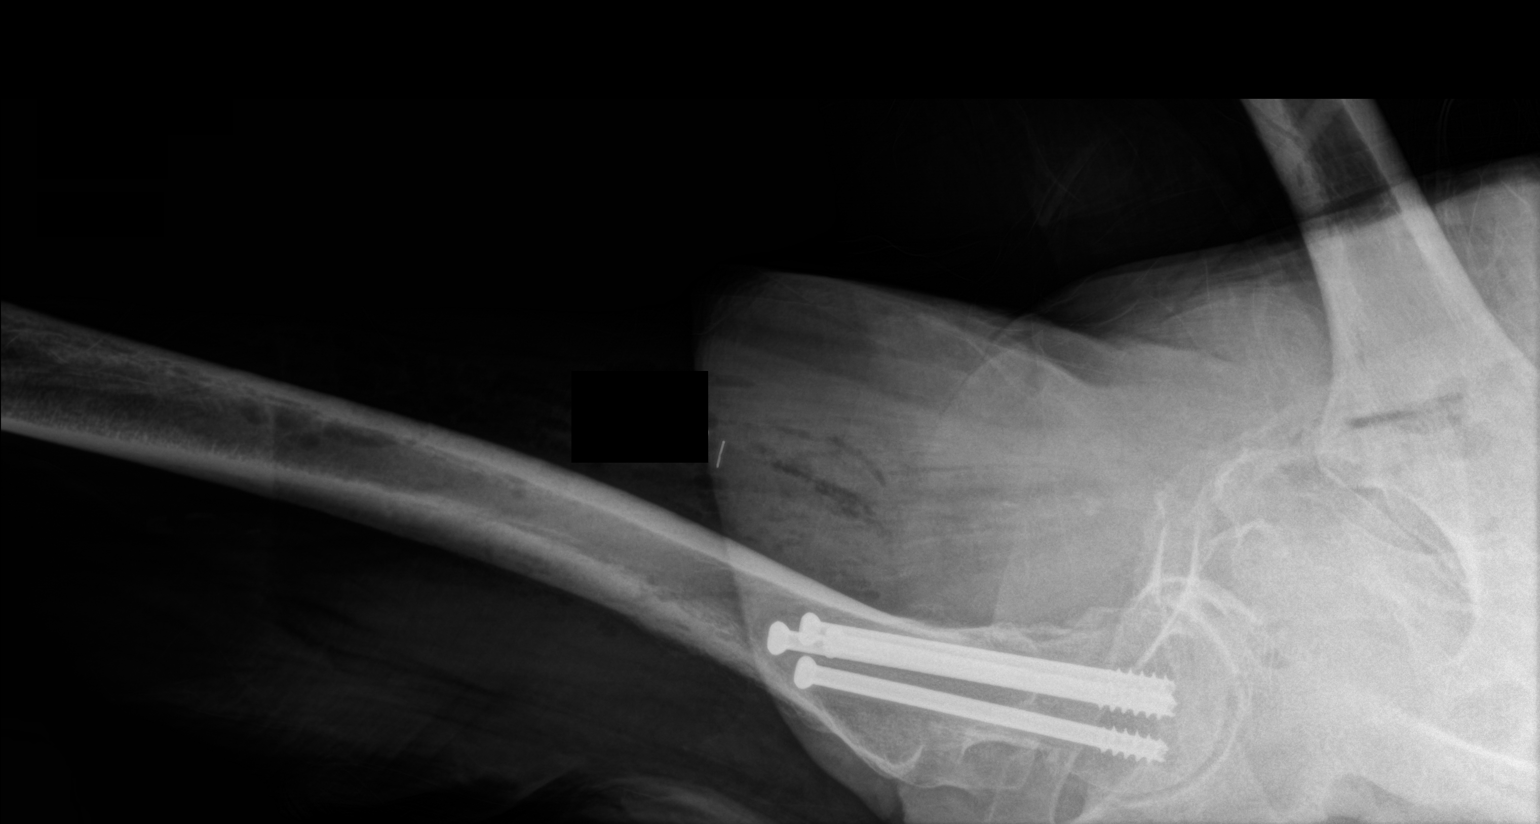

[1 of 1 positions shown; findings below may reference images not displayed]

FINDINGS: Again noted status post intraoperative repair of subcapital femoral
fracture repair. Four metallic screws are noted in femoral neck.
There is anatomic alignment.
IMPRESSION: Status post ORIF of subcapital right femoral fracture. Metallic
fixation material with anatomic alignment.

## 2016-01-09 IMAGING — CR RIGHT HIP - 1 VIEW
2 series · 2 of 2 positions shown · non-contrast
Comparison: Pelvis and right hip radiographs March 02, 2014

CLINICAL DATA: Open reduction internal fixation for fracture

EXAM:
RIGHT HIP - 1 VIEW; DG C-ARM 61-120 MIN

[k1]
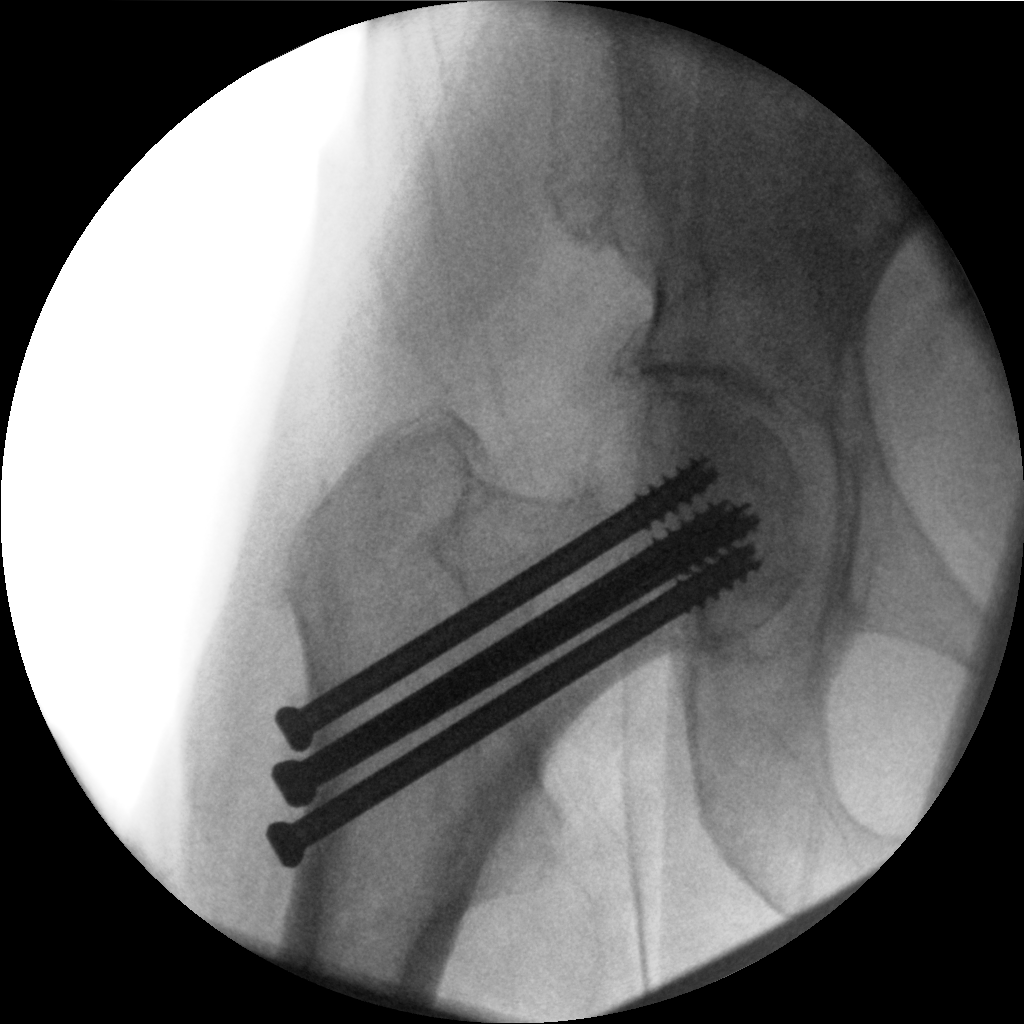

[k2]
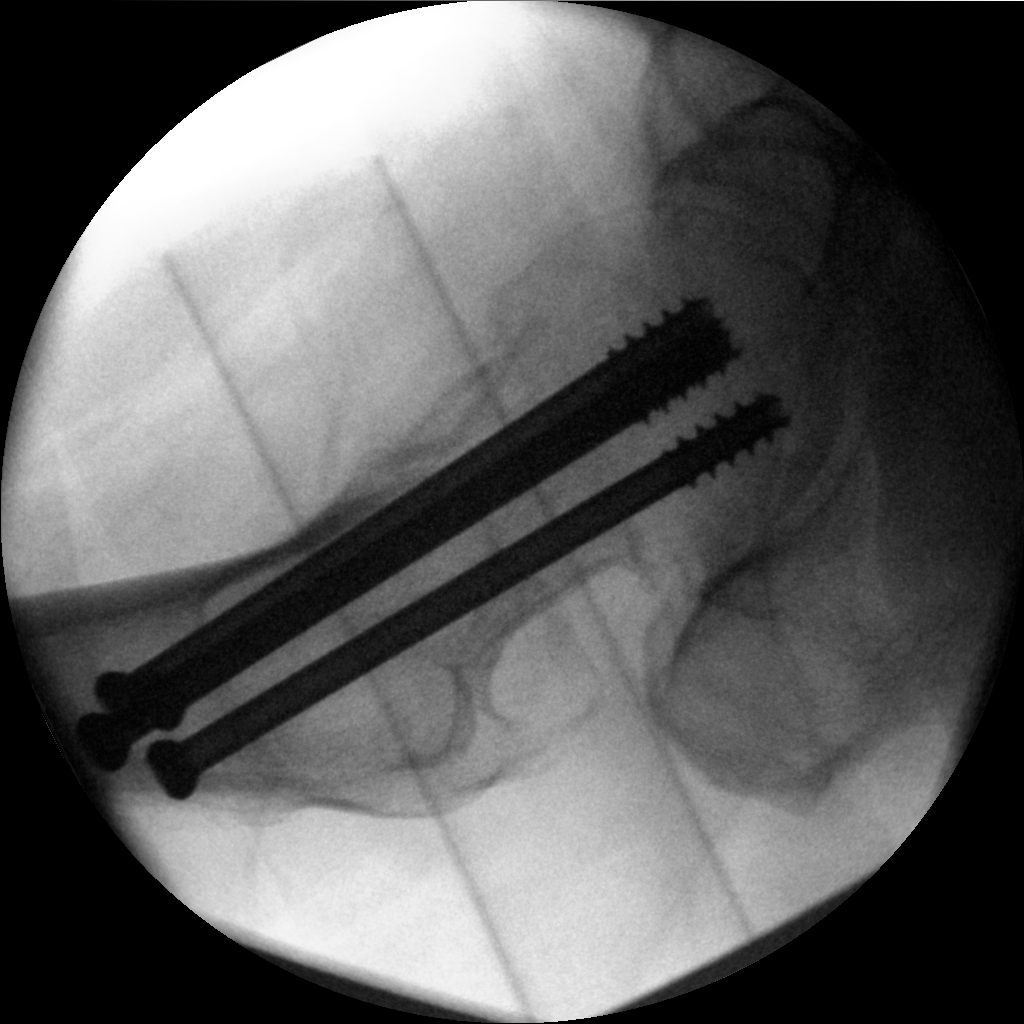

[2 of 2 positions shown; findings below may reference images not displayed]

FINDINGS: Four screw type nails transfix a subcapital femoral neck fracture on
the right. Alignment is anatomic at the fracture site. The tips of
the screws are in the proximal femoral head. No new fracture. No
dislocation.
IMPRESSION: Fracture site alignment is essentially anatomic.  No dislocation.
